# Patient Record
Sex: Female | Born: 1935 | Race: White | Hispanic: No | Marital: Married
Health system: Southern US, Community
[De-identification: ages and names within clinical notes are randomized; demographics above are authoritative.]

---

## 2000-05-15 ENCOUNTER — Encounter: Admission: RE | Admit: 2000-05-15 | Discharge: 2000-05-15 | Payer: Self-pay | Admitting: Internal Medicine

## 2000-05-15 ENCOUNTER — Encounter: Payer: Self-pay | Admitting: Internal Medicine

## 2000-10-20 ENCOUNTER — Ambulatory Visit (HOSPITAL_COMMUNITY): Admission: RE | Admit: 2000-10-20 | Discharge: 2000-10-20 | Payer: Self-pay | Admitting: Internal Medicine

## 2000-10-20 ENCOUNTER — Encounter: Payer: Self-pay | Admitting: Internal Medicine

## 2001-02-19 ENCOUNTER — Inpatient Hospital Stay (HOSPITAL_COMMUNITY): Admission: EM | Admit: 2001-02-19 | Discharge: 2001-02-20 | Payer: Self-pay | Admitting: Emergency Medicine

## 2001-02-24 ENCOUNTER — Ambulatory Visit (HOSPITAL_COMMUNITY): Admission: RE | Admit: 2001-02-24 | Discharge: 2001-02-24 | Payer: Self-pay | Admitting: Gastroenterology

## 2002-10-20 ENCOUNTER — Encounter: Admission: RE | Admit: 2002-10-20 | Discharge: 2002-10-20 | Payer: Self-pay | Admitting: Internal Medicine

## 2002-10-20 ENCOUNTER — Encounter: Payer: Self-pay | Admitting: Internal Medicine

## 2003-12-21 ENCOUNTER — Encounter: Admission: RE | Admit: 2003-12-21 | Discharge: 2003-12-21 | Payer: Self-pay | Admitting: Internal Medicine

## 2004-10-10 ENCOUNTER — Encounter: Admission: RE | Admit: 2004-10-10 | Discharge: 2004-10-10 | Payer: Self-pay | Admitting: Internal Medicine

## 2005-01-28 ENCOUNTER — Encounter: Admission: RE | Admit: 2005-01-28 | Discharge: 2005-01-28 | Payer: Self-pay | Admitting: Internal Medicine

## 2005-09-01 ENCOUNTER — Emergency Department (HOSPITAL_COMMUNITY): Admission: EM | Admit: 2005-09-01 | Discharge: 2005-09-01 | Payer: Self-pay | Admitting: Orthopedic Surgery

## 2006-02-16 ENCOUNTER — Emergency Department (HOSPITAL_COMMUNITY): Admission: EM | Admit: 2006-02-16 | Discharge: 2006-02-16 | Payer: Self-pay | Admitting: Emergency Medicine

## 2006-03-03 ENCOUNTER — Encounter: Admission: RE | Admit: 2006-03-03 | Discharge: 2006-03-03 | Payer: Self-pay | Admitting: Internal Medicine

## 2007-03-02 ENCOUNTER — Encounter: Admission: RE | Admit: 2007-03-02 | Discharge: 2007-03-02 | Payer: Self-pay | Admitting: Neurology

## 2007-03-06 ENCOUNTER — Encounter: Admission: RE | Admit: 2007-03-06 | Discharge: 2007-03-06 | Payer: Self-pay | Admitting: Internal Medicine

## 2007-05-06 ENCOUNTER — Encounter: Payer: Self-pay | Admitting: Cardiovascular Disease

## 2007-05-06 ENCOUNTER — Ambulatory Visit: Payer: Self-pay | Admitting: Cardiovascular Disease

## 2007-05-06 ENCOUNTER — Ambulatory Visit: Payer: Self-pay | Admitting: Vascular Surgery

## 2007-05-06 ENCOUNTER — Encounter: Payer: Self-pay | Admitting: Vascular Surgery

## 2007-05-06 ENCOUNTER — Inpatient Hospital Stay (HOSPITAL_COMMUNITY): Admission: EM | Admit: 2007-05-06 | Discharge: 2007-05-08 | Payer: Self-pay | Admitting: Emergency Medicine

## 2008-03-31 ENCOUNTER — Encounter: Admission: RE | Admit: 2008-03-31 | Discharge: 2008-03-31 | Payer: Self-pay | Admitting: Internal Medicine

## 2010-08-16 ENCOUNTER — Inpatient Hospital Stay (HOSPITAL_COMMUNITY): Admission: AD | Admit: 2010-08-16 | Discharge: 2010-08-20 | Payer: Self-pay | Admitting: Orthopedic Surgery

## 2010-10-24 IMAGING — CR DG SHOULDER 2+V PORT*R*
1 series · 1 of 1 positions shown · non-contrast
Comparison: [HOSPITAL] portable chest x-ray 08/16/2010,
intraoperative right shoulder radiographs 08/16/2010.

CLINICAL DATA: Right shoulder arthroplasty.

PORTABLE RIGHT SHOULDER - 1+ VIEW

[AP]
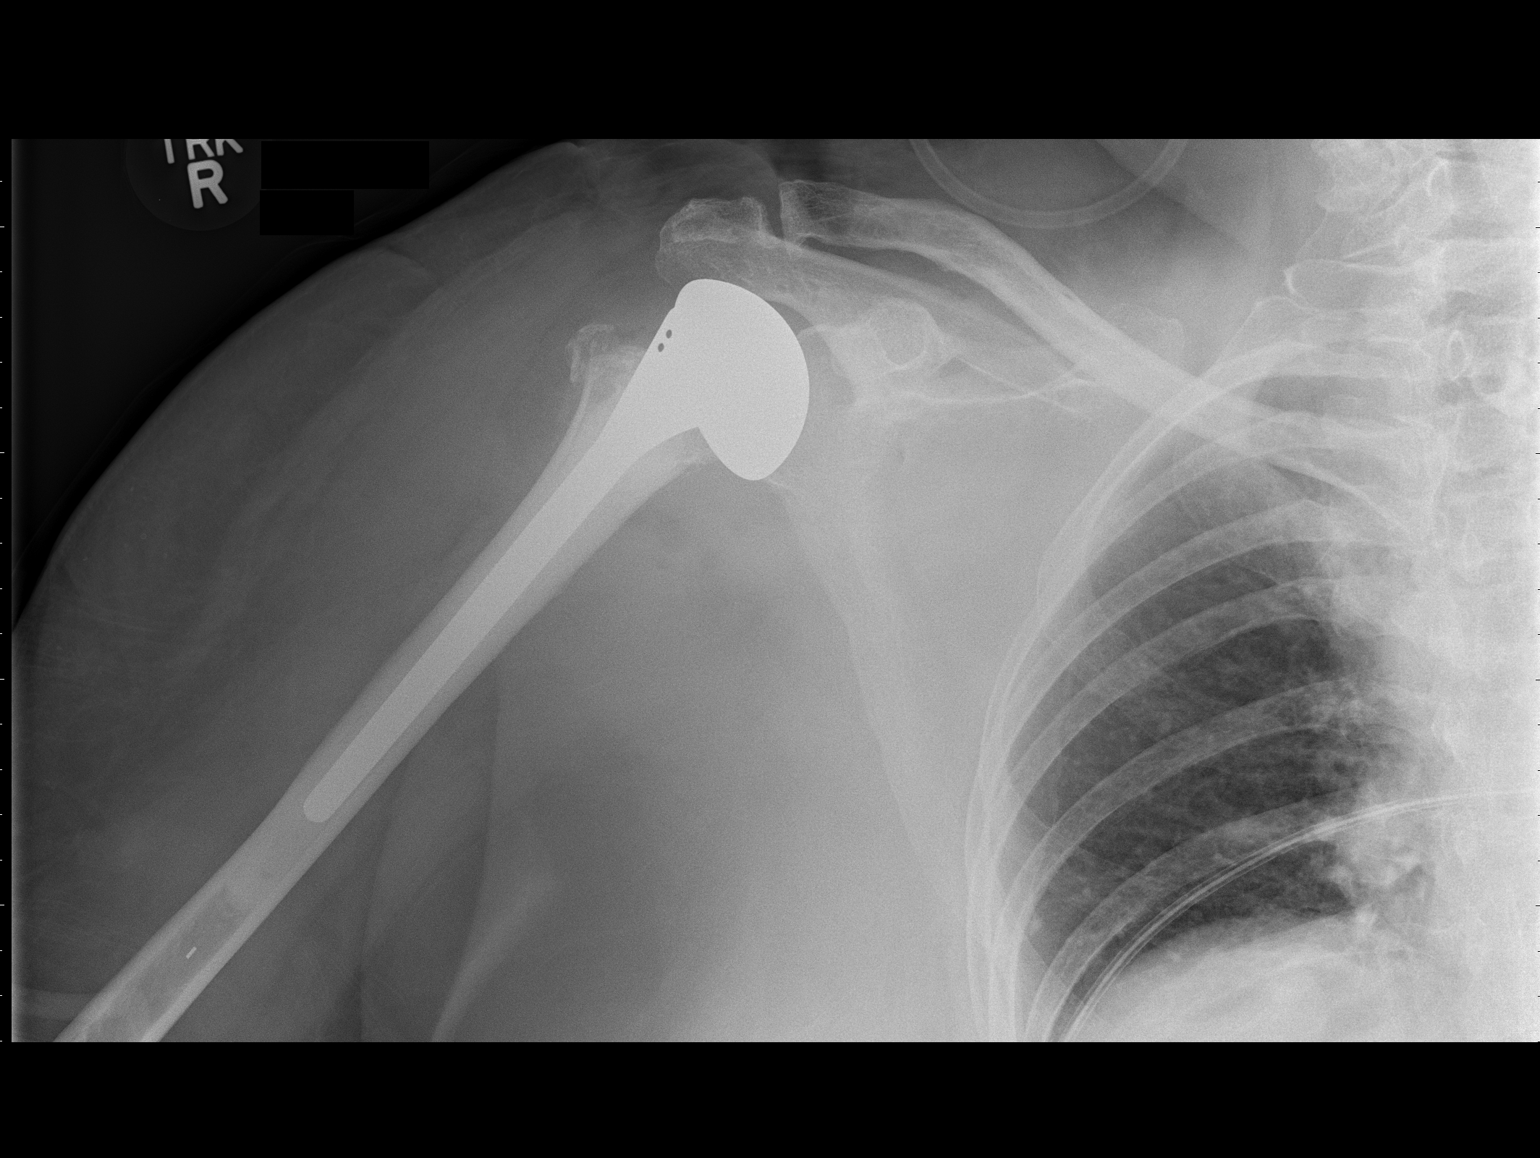

[1 of 1 positions shown; findings below may reference images not displayed]

FINDINGS: Interval placement right shoulder prosthesis imbedded in
radiopaque cement appears in satisfactory position/alignment.
Small residual supralateral proximal humeral single fracture
fragment seen with slight right AC degenerative joint disease.  No
other significant abnormality noted.
IMPRESSION: 1.  Interval placement right shoulder prosthesis imbedded in
radiopaque cement with satisfactory position/alignment.
2.  Small residual supralateral proximal humeral fracture fragment
and slight right AC degenerative joint disease.
3.  Otherwise, negative.

## 2011-03-07 LAB — BASIC METABOLIC PANEL
BUN: 10 mg/dL (ref 6–23)
BUN: 8 mg/dL (ref 6–23)
CO2: 27 mEq/L (ref 19–32)
CO2: 28 mEq/L (ref 19–32)
Calcium: 8.2 mg/dL — ABNORMAL LOW (ref 8.4–10.5)
Calcium: 8.5 mg/dL (ref 8.4–10.5)
Calcium: 8.8 mg/dL (ref 8.4–10.5)
Calcium: 9.4 mg/dL (ref 8.4–10.5)
Chloride: 102 mEq/L (ref 96–112)
Creatinine, Ser: 0.62 mg/dL (ref 0.4–1.2)
Creatinine, Ser: 0.65 mg/dL (ref 0.4–1.2)
GFR calc non Af Amer: >60 mL/min (ref >60)
GFR calc non Af Amer: >60 mL/min (ref >60)
Glucose, Bld: 146 mg/dL — ABNORMAL HIGH (ref 70–99)
Glucose, Bld: 171 mg/dL — ABNORMAL HIGH (ref 70–99)
Potassium: 3.9 mEq/L (ref 3.5–5.1)
Potassium: 4 mEq/L (ref 3.5–5.1)
Sodium: 135 mEq/L (ref 135–145)
Sodium: 140 mEq/L (ref 135–145)

## 2011-03-07 LAB — COMPREHENSIVE METABOLIC PANEL
ALT: 30 U/L (ref 0–35)
AST: 37 U/L (ref 0–37)
BUN: 9 mg/dL (ref 6–23)
CO2: 28 mEq/L (ref 19–32)
Calcium: 8.6 mg/dL (ref 8.4–10.5)
Chloride: 104 mEq/L (ref 96–112)
Creatinine, Ser: 0.69 mg/dL (ref 0.4–1.2)
Sodium: 140 mEq/L (ref 135–145)
Total Bilirubin: 0.8 mg/dL (ref 0.3–1.2)

## 2011-03-07 LAB — CBC
Hemoglobin: 12.2 g/dL (ref 12.0–15.0)
Hemoglobin: 14.5 g/dL (ref 12.0–15.0)
MCH: 31.2 pg (ref 26.0–34.0)
MCH: 32.4 pg (ref 26.0–34.0)
MCHC: 33.6 g/dL (ref 30.0–36.0)
MCV: 95.7 fL (ref 78.0–100.0)
MCV: 96.4 fL (ref 78.0–100.0)
RDW: 13.1 % (ref 11.5–15.5)
WBC: 11.2 10*3/uL — ABNORMAL HIGH (ref 4.0–10.5)

## 2011-03-07 LAB — HEMOGLOBIN AND HEMATOCRIT, BLOOD
HCT: 35.7 % — ABNORMAL LOW (ref 36.0–46.0)
Hemoglobin: 11.6 g/dL — ABNORMAL LOW (ref 12.0–15.0)
Hemoglobin: 11.8 g/dL — ABNORMAL LOW (ref 12.0–15.0)

## 2011-03-07 LAB — SURGICAL PCR SCREEN: MRSA, PCR: NEGATIVE

## 2011-05-07 NOTE — Discharge Summary (Signed)
NAME:  Gina Owen, Gina Owen                ACCOUNT NO.:  [id number]   MEDICAL RECORD NO.:  [id number]          PATIENT TYPE:  INP   LOCATION:  4739                         FACILITY:  [facility]   PHYSICIAN:  Hind I Elsaid, MD      DATE OF BIRTH:  February 03, 1923   DATE OF ADMISSION:  05/06/2007  DATE OF DISCHARGE:  05/08/2007                               DISCHARGE SUMMARY   DISCHARGE DIAGNOSIS:  1. Syncope secondary neurocardiogenic in nature with evidence of      hypotension, per EMS sheet.  2. History of Alzheimer dementia.  3. Hyperthyroidism.  4. History of gastrointestinal bleeding secondary to ibuprofen.  5. History of arthritis.  6. History of irritable bowel syndrome.  7. History of diverticulosis.   DISCHARGE MEDICATIONS:  Aricept 10 mg p.o. daily, Namenda 10 mg p.o.  daily, Synthroid 100 mcg p.o. daily, oxybutynin 5 mg p.o. 4 times a day,  cyclobenzaprine 10 mg p.o. daily, vitamin B12 1,000 mcg 2 tabs in the  morning and 1 in the evening, vitamin C, Os-Cal 500 mg p.o. daily,,  aspirin 81 mg p.o. daily, 12 glucosamine chondroitin sulfate.   PROCEDURES:  Chest x-ray, COPD, no acute abnormalities.  CT of head done  on May 06, 2007, advanced atrophy, consistent with a history of  Alzheimer disease, no acute stroke, hemorrhage or mass effect  demonstrated.  Echo done on May 06, 2007, left ventricular systolic  function was normal.  Left ventricular ejection fraction estimated to be  55%.  EKG was done during hospitalization.  Normal sinus rhythm, left  axis deviation, left ventricular hypertrophy with QRS widening and  intraventricular block.  There is no significant change from previous  EKG done on February 2007.   HISTORY OF PRESENT ILLNESS:  An 77 year old female.  A history of  advanced Alzheimer dementia, history of hypothyroidism, on Synthroid,  history of previous falls, last one 2 years ago presented by husband;  the patient history provided by the husband as the wife can not  remember  where the husband at least saw his wife standing up with a weak leg and  about to fall.  He grabbed her and put her on a chair and, according to  him, she was out of it for 5 minutes.  Denies any seizure and denies any  fever.  The patient made admission for presyncope, syncope workup.  According to the EMS, the patient was hypotensive with a blood pressure  of 60/40 on first visit.  The patient admitted for telemetry for close  monitoring.  The patient had 24-hour cardiac monitoring which was  negative for any kind of arrhythmia.  EKG was done with no significant  change.  CKMB and troponin was done.  There is no evidence of any  myocardial infarction.  Echo was done also which was negative for any  intraventricular wall dysfunction and ejection fraction was normal.  As  the patient was hypotensive, also sodium blood pressure was done during  hospitalization where the blood pressure on the first day of admission,  there is some kind of mild orthostasis with  sitting systolic 151/82,  pulse rate 69; when standing 126/69 with pulse rate 78.  The patient was  started on IV fluid and blood pressure orthostasis resolved completely,  improved and the patient's symptoms completely improved.   DISCHARGE DIAGNOSIS:  1. Syncope, presyncope most probably due to neurocardiogenic in nature      in addition to the dehydration.  Neurological exam was negative for      any evidence of stroke and CT scan was negative for any evidence of      stroke.  I do not think any reason to order MRI or MRA as the      patient has no evidence of any neurological deficit.  Also, carotid      duplex was done to rule out any carotid stenosis which was      negative.  There is no evidence of any carotid stenosis at this      time.  2. Hypothyroidism.  TSH was done during hospitalization where TSH was      0.648 so we will continue with the same dose of Synthroid.  3. Urinary incontinence.  The patient to  continue her own home      medication.  4. Hyperkalemia which responds to correction of electrolytes.   PLAN:  We think the patient is medically stable to be discharged home  and follow up with her primary care physician as outpatient.      Hind Bosie Helper, MD  Electronically Signed     HIE/MEDQ  D:  05/08/2007  T:  05/08/2007  Job:  [id number]   cc:   Genene Churn. Love, M.D.

## 2011-05-07 NOTE — H&P (Signed)
NAME:  Gina Owen, Gina Owen NO.:  [id number]   MEDICAL RECORD NO.:  [id number]          PATIENT TYPE:  EMS   LOCATION:  MAJO                         FACILITY:  [facility]   PHYSICIAN:  Hind I Elsaid, MD      DATE OF BIRTH:  January 11, 1923   DATE OF ADMISSION:  05/06/2007  DATE OF DISCHARGE:                              HISTORY & PHYSICAL   PRIMARY CARE PHYSICIAN:  Dr. Su Hilt   CHIEF COMPLAINT:  Syncope for 5 minutes.   HISTORY OF PRESENT ILLNESS:  Gina Owen is an 77 year old white female,  very pleasant, with a history of Alzheimer dementia, history of GI  bleeding in 2002 status post colonoscopy and endoscopy without any  significant finding, history of hypothyroidism on Synthroid with a  multiple history of falls to a result of fall, last fall about 2 years  ago, presented to the emergency room with a chief complaint of syncope  where history mainly presented by the husband, who admitted that patient  cannot remember anything about the episode.  According to her husband,  patient was standing and he was coming from outside and he was talking  to her and he just suddenly saw her, she was standing without any  impression and then she started to feel that her leg was weak and about  he fall and he had to grab her up and sit her up on the chair.  Husband  denies any history of seizure activity.  Denies any history of fever and  denies any history of loss of consciousness, but he admitted that she  was out of it.  She could not answer any questions for almost 5 minutes.  He denies any urine incontinence or bowel incontinence.  It took, like 5  minutes, then the patient regained her conscious and at that time, she  denies any chest pain, denies any shortness of breath, denies any  headache, denies any numbness or weakness of her extremities, denies any  drooping of saliva and denies any blurring of vision and she returned  back to her normal level of conscious.  Husband had to  call EMS, where  according to his report that blood pressure was extremely low, around 44  and then was repeated where systolic blood pressure around 65.  When the  patient seen in the emergency room, her blood pressure returned back to  118/40, status post IV fluids.  On detailed discussion with patient,  patient cannot remember what exactly what happened to her.  No history  of similar condition as above.  No chest pain.  No shortness of breath.  No change in bowel habit.  No burning with urination.  No neck  stiffness.  No loss of bowel or harm herself.   PAST MEDICAL HISTORY:  History of:  1. Alzheimer dementia.  2. History of hypothyroidism.  She is on Synthroid.  3. History of gastrointestinal bleeding, secondary to ibuprofen.  4. History of arthritis.  5. History of irritable bowel syndrome.  6. Diverticular.   MEDICATION:  1. Aricept 10 mg p.o. daily.  2. Candiss Norse  10 mg p.o. daily.  3. Synthroid 100 mcg p.o. daily.  4. Oxybutynin 5 mg p.o. 4 times a day.  5. Cyclobenzaprine 10 mg p.o. daily.  6. Vitamin B12 1000 mcg 2 tabs, one in the morning and one in the      evening.  7. Vitamin C 500 mg 1 tab p.o. q.a.m.  8. Citracal 1 tab p.o. daily.  9. Vitamin D 400 one tab p.o. daily.  10.Ginkgo 50 mg 6 tabs 3:00 a.m. and 3:00 p.m.  11.Coreg 6.25 mg.  12.Aspirin 81 mg.  13.Glucosamine chondroitin sulfate.   ALLERGIES:  NO KNOWN DRUG ALLERGIES.   PAST SURGICAL HISTORY:  1. History of hysterectomy.  2. Tonsillectomy.  3. History of inguinal/femoral hernia.   FAMILY HISTORY:  History of esophageal cancer in the sister and her  mother.  Father died at age 21 due to Alzheimer dementia.   She lives with her husband.  She is a retired [employer] administrator.  No  history of smoking or alcohol abuse.   PHYSICAL EXAMINATION:  GENERAL:  Patient is not in acute distress at  this time, lying comfortably in bed.  VITAL SIGNS:  Temperature 96.9.  Blood pressure 127/73.  Pulse rate 60.   Respiratory rate 16.  Oxygen saturation 96% on room air.  HEENT:  Sclerae are nonicteric.  Not pale.  Not jaundice.  NECK:  Supple without obvious adenopathy.  LUNGS:  No vesicular breathing with equal air entry.  HEART:  Regular rate and rhythm.  ABDOMEN:  Soft and nontender.  RECTAL EXAM:  Not being done.  NEUROLOGIC EXAMINATION:  Patient alert and oriented x2.  She follows  commands.  Cranial nerves II-XII intact.  Patient can move all her  extremities and sensation intact.  The Babinski sign is negative.  Cerebellar sign negative and gait was not done as the patient was lying  on bed.   LAB DONE IN THE EMERGENCY ROOM:  Sodium 139, potassium 4.0, chloride  107, BUN 11, glucose 103, CO2 45, bicarb 26, hemoglobin 14.3 and  hematocrit 42.0, creatinine 1.0, white blood cell 5.7, hemoglobin 13.6,  hematocrit 44.4, platelet 226, myoglobin 75.  Troponin and CK-MB was  negative.  Urinalysis was negative.  EKG:  Normal sinus rhythm with some  left axis deviation and some left ventricular hypertrophy.   ASSESSMENT/PLAN:  1. Syncopal episode.  2. Hypotension.  3. Alzheimer dementia.  4. Hypothyroidism.   ASSESSMENT/PLAN:  1. Patient will be admitted for telemetry for evaluation because of      syncope to rule out cardiogenic versus neurogenic cause.  We will      get CK-MB and troponin and EKG in the morning.  We will get a 2D      echo.  We will get CT scan of her head without contrast to rule out      any intracranial abnormalities and carotid duplex.  If possible, we      will get an MRI and MRA of her brain.  Unlikely patient has any      result of seizure as per the husband and we will get CPK to rule      out any rhabdomyolysis.  2. Hypotension.  Cause is really unclear.  It could be related to      medications.  We will hold off on medication at this time.  We will      continue with IV fluid hydration.  We will get blood culture to  rule out occult infection and urinalysis  and culture.  We will      monitor the patient closely.  3. Hypothyroidism.  Patient was Synthroid 100 mcg.  We will get the      TSH and free T4.  Symptoms could be related to the uncontrolled      hypothyroidism.  We will check that and also we will check cortisol      levels in the morning.  4. Alzheimer dementia.  We will continue with Aricept and Namenda.  If      the above condition improves and deep venous thrombosis and      gastrointestinal prophylaxis.      Hind Bosie Helper, MD  Electronically Signed     HIE/MEDQ  D:  05/06/2007  T:  05/06/2007  Job:  [id number]

## 2011-05-07 NOTE — Discharge Summary (Signed)
NAME:  Gina Owen, Gina Owen                ACCOUNT NO.:  [id number]   MEDICAL RECORD NO.:  [id number]          PATIENT TYPE:  INP   LOCATION:  4739                         FACILITY:  [facility]   PHYSICIAN:  Hind I Elsaid, MD      DATE OF BIRTH:  1923/09/17   DATE OF ADMISSION:  05/06/2007  DATE OF DISCHARGE:  05/08/2007                               DISCHARGE SUMMARY   DICTATION ENDED AT THIS POINT.      Hind Bosie Helper, MD  Electronically Signed     HIE/MEDQ  D:  05/08/2007  T:  05/09/2007  Job:  [id number]

## 2011-05-10 NOTE — Discharge Summary (Signed)
[facility]  Patient:    Gina Owen, Gina Owen                      MRN: [id number] Adm. Date:  [id number] Disc. Date: [id number] Attending:  [name]                           Discharge Summary  FINAL DIAGNOSES: 1. Upper GI bleed this admission.  Etiology unknown. 2. Alzheimers dementia.  HISTORY OF ILLNESS:  This 77 year old white female was admitted because of a 2-day history of melena.  Some confusion exists because the patient was taking intermittent Pepto Bismol but in the Emergency Department the stools the strongly heme positive.  Also, the patients hemoglobin in the emergency room was 14 which dropped to 13.6 the day following admission.  HOSPITAL COURSE:  Patient was admitted to the general medical floor and placed on Protonix.  The patient was seen by the courtesy of Dr. Leary Roca who performed an upper endoscopy which was essentially normal.  The patient showed no further signs of bleeding while in the hospital and basically was stable hematologically, and early discharge planning was arranged with follow up colonoscopy to be planned by Dr. Ewing Schlein in the near future.  LABORATORY DATA:  PT and PTT essentially normal.  Hemoglobin of 14.1 on admission.  Electrolytes normal.  Hematocrit 39.6 after 24 hours.  DISPOSITION:  Medicines on discharge will be: 1. Synthroid 0.1 mg per day. 2. Premarin 0.625 mg per day. 3. Aricept 5 mg per day. 4. Protonix 40 mg per day.  DIET:  Patient will resume regular diet.  FOLLOW UP:  Appointment by Dr. Ewing Schlein in the near future.  CONDITION ON DISCHARGE:  Improved. DD:  02/20/01 TD:  02/21/01 Job: [id number]

## 2011-05-10 NOTE — Op Note (Signed)
[place]. [facility]  Patient:    Gina Owen, Gina Owen                      MRN: [id number] Proc. Date: 02/20/01 Adm. Date:  [id number] Attending:  [name] CC:         [name], M.D.   Operative Report  PROCEDURE PERFORMED:  Esophagogastroduodenoscopy.  ENDOSCOPIST:  Petra Kuba, M.D.  INDICATIONS FOR PROCEDURE:  Patient with guaiac positivity on nonsteroidals, want to rule out upper tract lesion.  Consent was signed after risks, benefits, methods, and options were thoroughly discussed with the patient and multiple family members including her husband.  MEDICATIONS USED:  Demerol 40, Versed 2.5.  DESCRIPTION OF PROCEDURE:  The video endoscope was inserted by direct vision. The esophagus was normal.  In the distal esophagus was a tiny hiatal hernia. The scope passed into the stomach and advanced to the antrum pertinent for a minimal amount of antritis through a normal pylorus into the duodenal bulb pertinent for a minimal amount of bulbitis and around the C-loop to a normal second and probably third and maybe even a fourth part of the duodenum.  The scope was slowly withdrawn back to the bulb without additional findings were seen.  A good look again at the bulb ruled out ulcers in that location.  Scope was withdrawn back to the stomach and retroflexed.  Cardia, fundus, angularis, lesser and greater curve were all normal.  Scope was straightened and straight visualization in the stomach did not reveal any additional findings.  Air was suctioned and the scope slowly withdrawn.  Again, a good look at the esophagus was normal.  The scope was removed.  The patient tolerated the procedure well. There was no obvious immediate complication.  ENDOSCOPIC DIAGNOSIS: 1. Tiny hiatal hernia. 2. Minimal antritis, bulbitis. 3. Otherwise normal esophagogastroduodenoscopy.  PLAN:  No aspirin or nonsteroidals.  Colon check as an outpatient next week  or the following week.  Consider for diarrhea trying Carafate, Questran, Bentyl or Levbid instead of her Pepto-Bismol and see if that doesnt change the color of her stools and work better.  As long as hemoglobin is stable, would recommend discharge at this time and continue work-up as an outpatient and will slowly advance her diet.  Please call us if any plan seems to make more sense to you. DD:  02/20/01 TD:  02/20/01 Job: [id number]

## 2011-05-10 NOTE — Procedures (Signed)
[place]. [facility]  Patient:    Gina Owen, Gina Owen                      MRN: [id number] Proc. Date: 02/24/01 Adm. Date:  [id number] Attending:  Nelda Marseille CC:         [name], M.D.   Procedure Report  REFERRING PHYSICIAN:  [name], M.D.  PROCEDURE PERFORMED:  Colonoscopy.  ENDOSCOPIST:  Petra Kuba, M.D.  INDICATIONS FOR PROCEDURE:  Guaiac positivity, nondiagnostic endoscopy.  Consent was signed after risks, benefits, methods, and options were thoroughly discussed with both the patient and her husband.  MEDICATIONS USED:  Demerol 70 mg, Versed 7 mg.  DESCRIPTION OF PROCEDURE:  Rectal inspection was pertinent for external hemorrhoids.  Digital exam was negative.  The video pediatric colonoscope was inserted with lots of difficulty due to an extremely tortuous sigmoid turn with lots of diverticula.  We were able to once through at area advance to the cecum.  This did require various abdominal pressure and to advance to the cecum required rolling her on her back and various abdominal pressures as identified by the appendiceal orifice and ileocecal valve.  The scope was inserted a short ways into the terminal ileum which was normal and then slowly withdrawn.  The prep was adequate.  She did have some formed stool.  The scope was then slowly withdrawn.  She did have a tortuous colon and as we fell back around a loop, we did try to readvance around the curves again. She did have rare right-sided diverticula.  The majority of the diverticula were in the sigmoid.  She did have some formed stool walls which we could wash and move out of the way but not suction, but on slow withdrawal through the colon, no masses, polyps or other abnormalities were seen.  That sigmoid was particularly tortuous.  Once back in the rectum, she had trouble holding air but we were able to retroflex, pertinent for some small internal hemorrhoids. The  scope was straightened, air was suctioned and the scope removed.  The patient tolerated the procedure well.  There was no obvious immediate complication.  ENDOSCOPIC DIAGNOSIS: 1. Internal and external hemorrhoids. 2. Tortuous sigmoid with diverticula. 3. Occasional right and transverse diverticula. 4. Otherwise within normal limits to the terminal ileum without any signs    of bleeding.  PLAN:  Will try Bentyl for her diarrhea, consider increasing fiber or other things like Carafate, Questran or a stronger antispasmodic.  Follow up p.r.n. or in six weeks to recheck CBC, guaiac and symptoms and decide another work-up and plans.  Based on her other medical problems, age and particularly tortuous sigmoid, would probably opt for an x-ray for future screening as needed. DD:  02/24/01 TD:  02/25/01 Job: [id number]

## 2011-05-10 NOTE — H&P (Signed)
[place]. [facility]  Patient:    Gina Owen                    MRN: [id number] Adm. Date:  02/18/01 Attending:  [name], M.D.                         History and Physical  HISTORY OF PRESENT ILLNESS:  This 77 year old white female was admitted because of two-day history of melena.  In the emergency department rectal exam was confirmatory with heme positive black stool.  Patient has recently been diagnosed as early dementia of Alzheimers type by Dr. Orlin Hilding and has been started on Aricept.  She also has started ibuprofen on her otherwise negative because of reports that it prevents dementia.  Patient denies prior GI problems.  Hemoglobin in the ER was 14, blood pressure 120/70, pulse 60.  MEDICATIONS ON ADMISSION: 1. Synthroid 0.2 mg per day. 2. Premarin 0.625 mg per day. 3. Ibuprofen 200 b.i.d. 4. Aricept 5 mg per day.  PAST MEDICAL HISTORY:  Noncontributory.  PHYSICAL EXAMINATION:  VITAL SIGNS:  Blood pressure 120/70 lying, 110/70 sitting; pulse 68; respirations 24.  SKIN:  Warm and dry, nondiaphoretic.  Multiple benign skin lesions present.  HEENT:  Normal.  NECK:  Supple with no adenopathy or thyromegaly.  LUNGS:  Clear to auscultation and percussion.  HEART:  Regular rhythm, no murmurs, rubs, or gallops.  ABDOMEN:  Soft, bowel sounds brisk.  RECTAL:  Heme positive melena.  IMPRESSION:  NSAID-induced GI bleed.  PLAN:  Supportive care as per orders.  Upper endoscopy in the a.m. DD:  02/18/01 TD:  02/19/01 Job: [id number]

## 2011-05-10 NOTE — Consult Note (Signed)
[place]. [facility]  Patient:    Gina Owen, Gina Owen Visit Number: [id number] MRN: [id number]          Service Type: MED Location: 3000 [id number] Attending Physician:  [name] Proc. Date: 02/19/01 Admit Date:  02/18/2001   CC:         Petra Kuba, M.D.  John C. [name], M.D.  [name], M.D.   Consultation Report  HISTORY OF PRESENT ILLNESS:  The patient seen by my partner in 1994 and 1995 with workups to include a negative endoscopy and a colonoscopy showing diverticuli only was admitted with black stools and guaiac positivity. However, her hemoglobin has been normal as had her BUN.  She has been on some ibuprofen but also takes a fair amount of Pepto-Bismol for her diarrhea and irritable bowel syndrome as well as drinks a fair amount of grape juice.  She will have periods of diarrhea off and on and she says it has been like that for years.  She has had no weight loss and has no heartburn, indigestion, or swallowing problems.  She has not had any recent tests or any significant recent medical problems other than a recent diagnosis of Alzheimers by Dr. Gustavus Messing. Weymann.  PAST MEDICAL HISTORY:  Pertinent for some arthritis, IBS diverticuli with surgeries long ago to include a hysterectomy, tonsillectomy, inguinal and femoral hernias.  FAMILY HISTORY:  Pertinent for cancer of the esophagus in a sister and her mother.  There was a question of colon cancer in an uncle in our chart, not mentioned today by the family.  SOCIAL HISTORY:  Denies alcohol or tobacco.  Does use the ibuprofen as mentioned above.  REVIEW OF SYSTEMS:  Negative except above.  CURRENT MEDICATIONS:  Synthroid, ibuprofen, Premarin, MDI, and Aricept.  PHYSICAL EXAMINATION:  GENERAL:  No acute distress, lying comfortably in the bed.  HEENT:  Sclerae are nonicteric.  NECK:  Supple without obvious adenopathy.  LUNGS:  Clear.  HEART:  Regular rate and  rhythm.  ABDOMEN:  Soft and nontender.  RECTAL:  Not repeated, guaiac positive per the chart.  LABORATORY DATA:  Labs are pertinent for normal CBC and chemistry panel. Please see chart for details.  ASSESSMENT: 1. Guaiac positivity questionably due to ibuprofen. 2. Black stools possibly due to her Pepto-Bismol and her grape juice. 3. History of diverticuli and irritable bowel syndrome. 4. Early Alzheimers. 5. Degenerative joint disease.  PLAN:  No aspirin or nonsteroidals.  Tylenol only.  Agree with Protonix for now.  The risks, benefits, and methods of upper endoscopy were discussed with the patient and her husband we will proceed tomorrow at 7:30.  If this were negative, there were no signs of bleeding, and she was stable, I believe she could go home for now.  Patient for colonoscopy next week and I have discussed that with the family since it is not emergency at this time.  I feel like we can colonoscopy and continued hospitalization would not be needed since her hemoglobin is normal and she is stable.  I would leave that up to Dr. [name] and Dr. Llana Aliment. Edwards, who is oncall this weekend, with further workup and plans pending the endoscopy findings. Attending Physician:  [name] DD:  02/19/01 TD:  02/20/01 Job: [id number]

## 2011-05-10 NOTE — Consult Note (Signed)
Gina Owen. [facility]  Patient:    Gina Owen, Gina Owen                      MRN: [id number] Proc. Date: 02/19/01 Adm. Date:  [id number] Attending:  [name] CC:         [name]. [name], M.D.  [name], M.D.   Consultation Report  HISTORY:  The patient seen by my partner in 1994 and 1995, with work-ups to include a negative endoscopy and a colonoscopy showing diverticula only.  He was admitted with black stools and guaiac positivity.  However, her hemoglobin has been normal as has her BUN.  She has been on some ibuprofen but also takes a fair amount of Pepto-Bismol for her diarrhea and irritable bowel syndrome as well as drinks a fair amount of grape juice.  She will have periods of diarrhea off and on and she says it has been like that for years.  She has had no weight loss and has no heartburn, indigestion, or swallowing problems.  Has not had any recent tests or any significant recent medical problem other than a recent diagnosis of Alzheimers by Dr. Orlin Hilding.  PAST MEDICAL HISTORY:  Pertinent for some arthritis, IBS diverticula with surgeries long ago to include hysterectomy, tonsillectomy, inguinal and femoral hernias.  FAMILY HISTORY:  Pertinent for cancer of the esophagus in a sister and her mother.  There was a question of colon cancer in an uncle in our chart, not mentioned today by the family.  SOCIAL HISTORY:  Denies alcohol or tobacco.  Does use the ibuprofen as mentioned above.  REVIEW OF SYSTEMS:  Negative except as above.  CURRENT MEDICATIONS:  Synthroid, ibuprofen, Premarin, MDI, and Aricept.  PHYSICAL EXAMINATION:  GENERAL:  In no acute distress, lying comfortably in the bed.  HEENT:  Sclerae nonicteric.  NECK:  Supple without obvious adenopathy.  LUNGS:  Clear.  HEART:  Regular rate and rhythm.  ABDOMEN:  Soft and nontender.  RECTAL:  Exam not repeated.  Guaiac positive per the chart.  LABORATORY:  Normal  CBC and chemistry panel.  Please see chart for details.  ASSESSMENT:  1. Guaiac positivity, questionably due to ibuprofen.  2. Black stools possibly due to her Pepto-Bismol and her grape juice.  3. History of diverticula and irritable bowel syndrome.  4. Early Alzheimers.  5. Degenerative joint disease.  PLAN:  No aspirin or nonsteroidals - Tylenol only.  Agree with Protonix for now.  The risks and benefits and methods of upper endoscopy were discussed with the patient and her husband and we will proceed tomorrow at 7:30.  If this were negative and there were no signs of bleeding and she was stable, I believe she could go home for now.  Patient colonoscopy next week and I have discussed that with the family.  Since it is not an emergency at this time, I feel like we can colonoscopy and continued hospitalization would not be needed since her hemoglobin is normal and she is stable.  I would leave that up to Dr. Su Hilt and Dr. Randa Evens who are on call this weekend, with further work-up and plans pending endoscopy findings. DD:  02/19/01 TD:  02/20/01 Job: [id number]

## 2014-01-23 DEATH — deceased

## 2014-01-31 ENCOUNTER — Telehealth: Payer: Self-pay

## 2014-01-31 NOTE — Telephone Encounter (Signed)
[facility] [place]  [facility] [place]
